# Patient Record
Sex: Female | Born: 1965 | Race: Asian | Hispanic: No | Marital: Married | Smoking: Never smoker
Health system: Southern US, Community
[De-identification: ages and names within clinical notes are randomized; demographics above are authoritative.]

## PROBLEM LIST (undated history)

## (undated) DIAGNOSIS — L659 Nonscarring hair loss, unspecified: Secondary | ICD-10-CM

## (undated) DIAGNOSIS — E785 Hyperlipidemia, unspecified: Secondary | ICD-10-CM

## (undated) DIAGNOSIS — H1010 Acute atopic conjunctivitis, unspecified eye: Secondary | ICD-10-CM

## (undated) DIAGNOSIS — M5090 Cervical disc disorder, unspecified, unspecified cervical region: Secondary | ICD-10-CM

## (undated) HISTORY — DX: Acute atopic conjunctivitis, unspecified eye: H10.10

## (undated) HISTORY — DX: Cervical disc disorder, unspecified, unspecified cervical region: M50.90

## (undated) HISTORY — DX: Hyperlipidemia, unspecified: E78.5

## (undated) HISTORY — DX: Nonscarring hair loss, unspecified: L65.9

---

## 2008-10-05 ENCOUNTER — Emergency Department (HOSPITAL_COMMUNITY): Admission: EM | Admit: 2008-10-05 | Discharge: 2008-10-05 | Payer: Self-pay | Admitting: Emergency Medicine

## 2009-06-28 IMAGING — CR DG CHEST 2V
2 series · 2 of 2 positions shown · non-contrast
Comparison: None.

CLINICAL DATA: 42-year-old female with pain under the chest,
abdominal pain and sweating.

CHEST - 2 VIEW

[w chest pa]
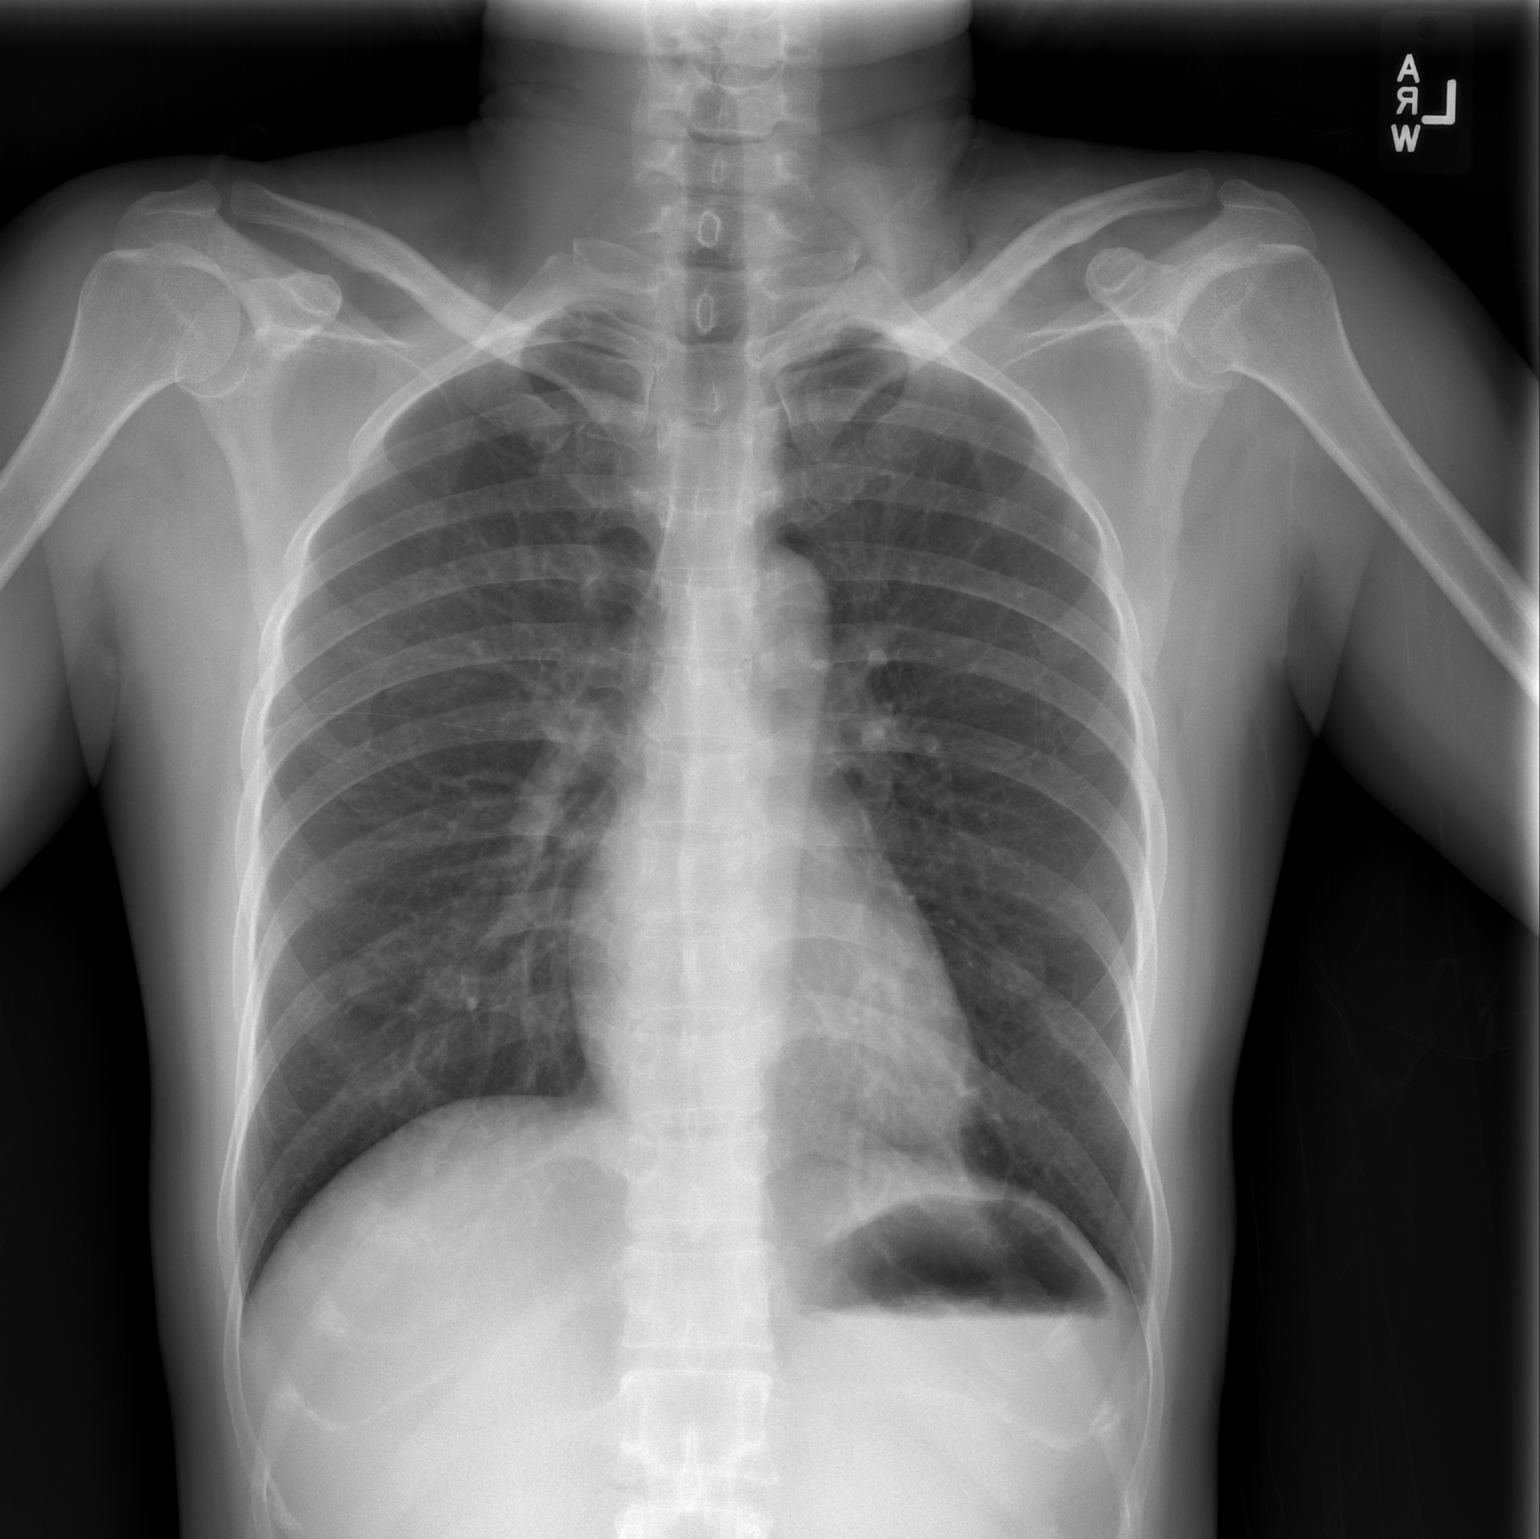

[w chest lat]
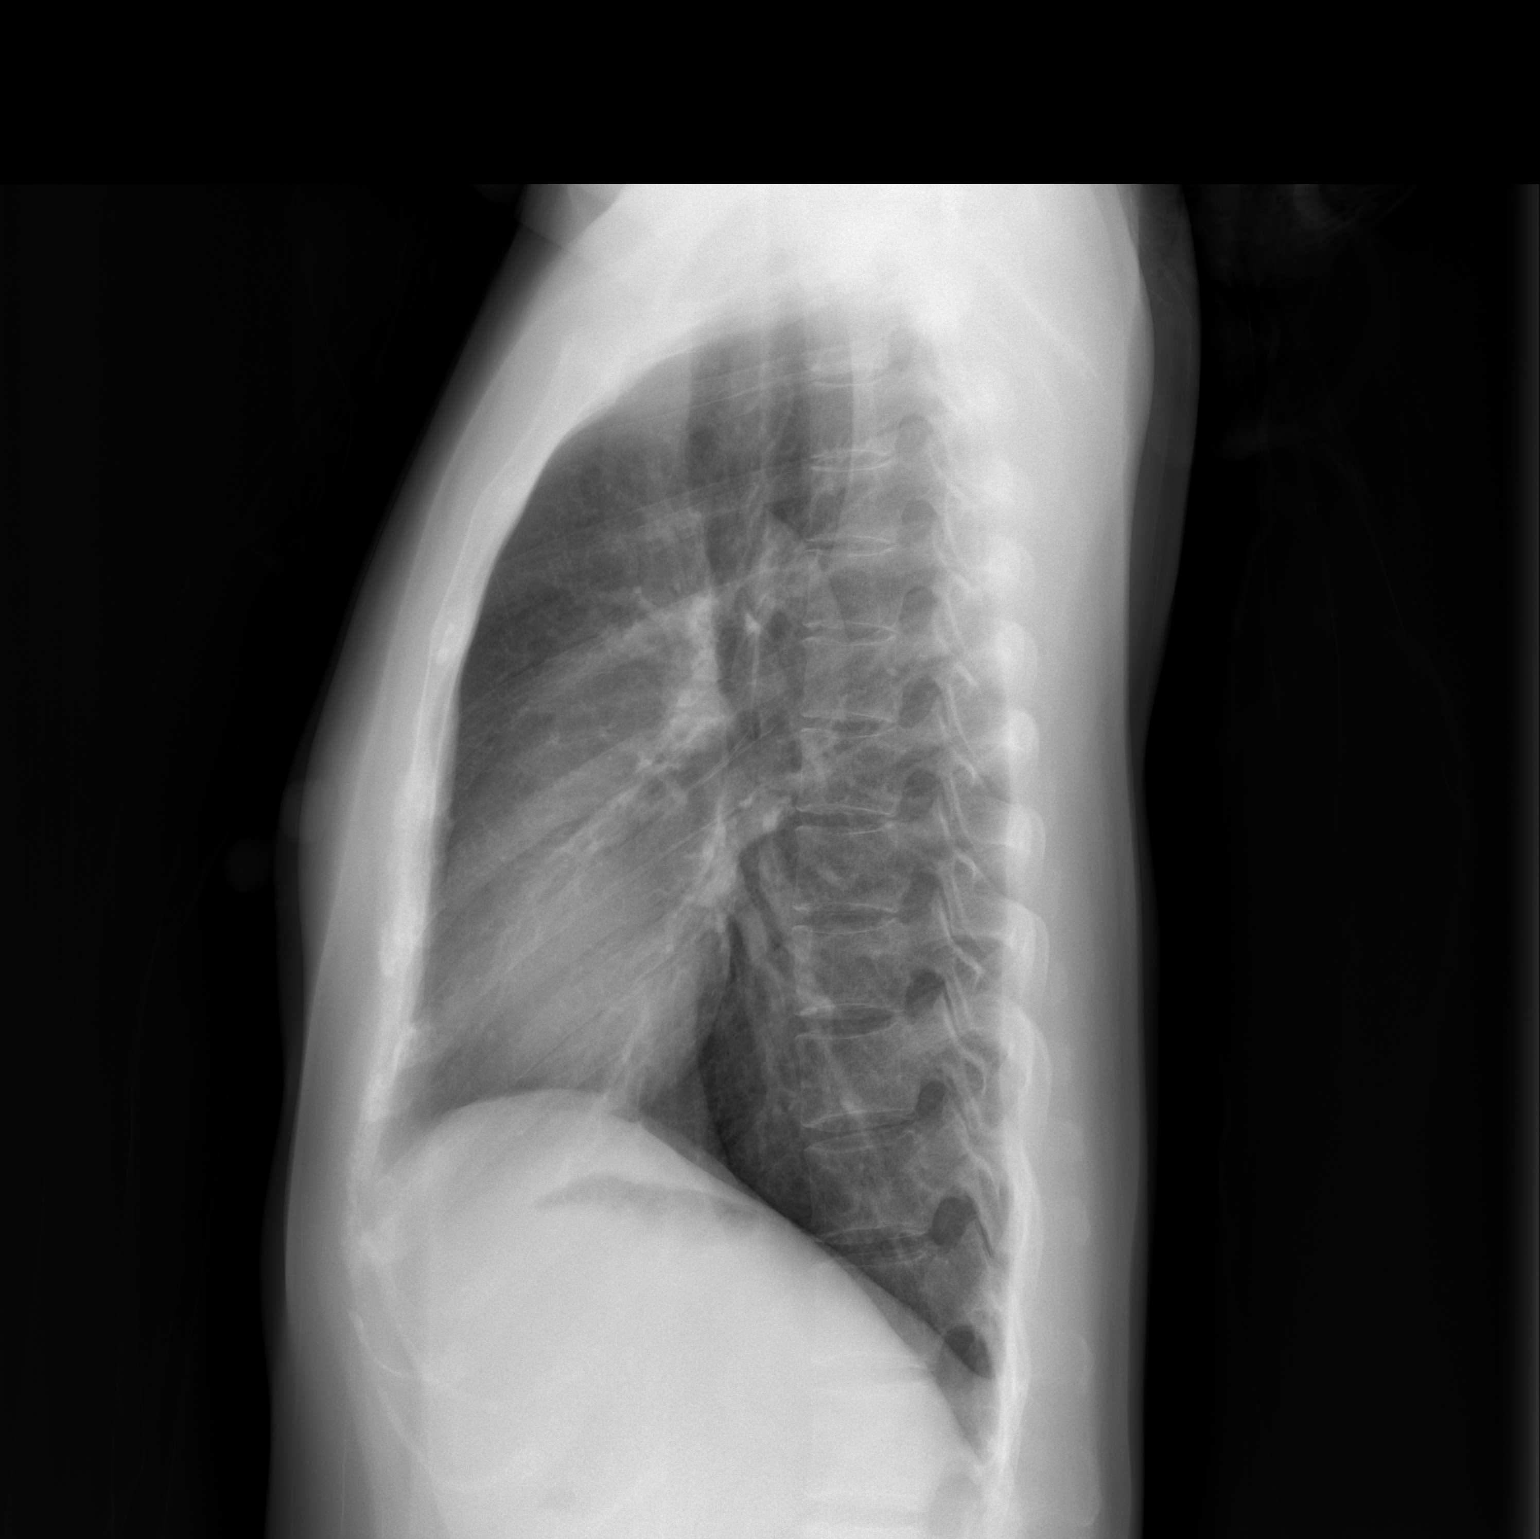

[2 of 2 positions shown; findings below may reference images not displayed]

FINDINGS: Incidental bilateral nipple shadows.  Cardiac size and
mediastinal contours are within normal limits.  Lung volumes are at
the upper limits of normal.  No pneumothorax, pulmonary edema,
pleural effusion or confluent airspace opacity. Tracheal air column
is within normal limits.  No acute osseous abnormality identified.
IMPRESSION: No acute cardiopulmonary abnormality.

## 2009-06-28 IMAGING — US US ABDOMEN COMPLETE
1 series · 14 of 25 positions shown · non-contrast
Comparison: 

CLINICAL DATA: Abdominal pain.

ABDOMINAL ULTRASOUND
TECHNIQUE: Abdominal ultrasound examination was performed
including evaluation of the liver, gallbladder, bile ducts,
pancreas, kidneys, spleen, IVC, and abdominal aorta.

[Series 1: us abdomen complete · 0.30mm/px · 50 acquisitions, 14 frames shown]
[im 1/50]
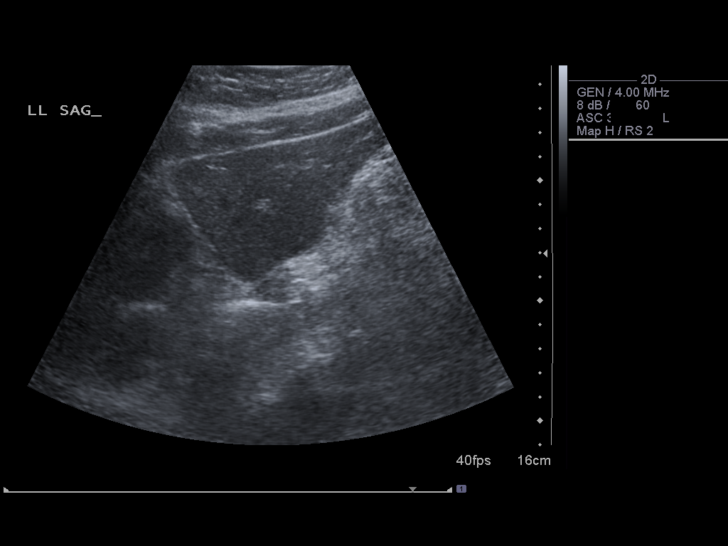
[im 5/50]
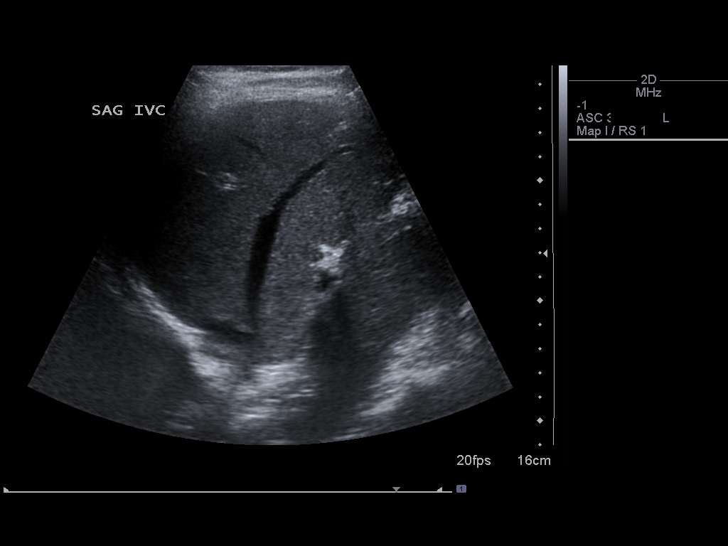
[im 9/50]
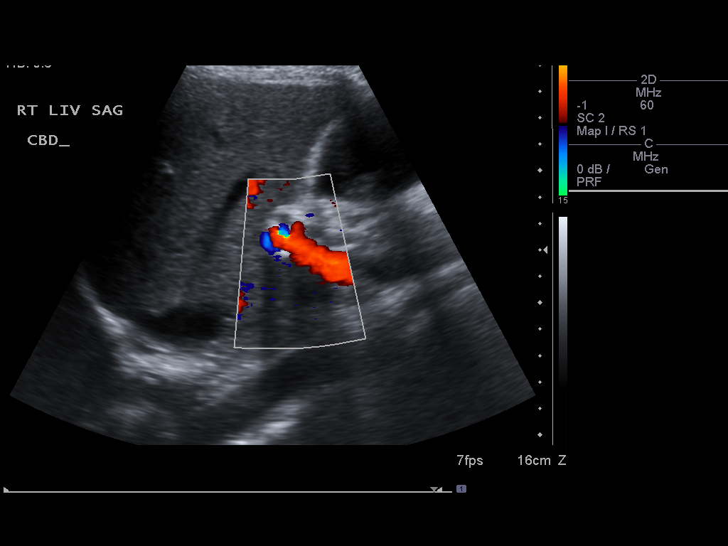
[im 13/50]
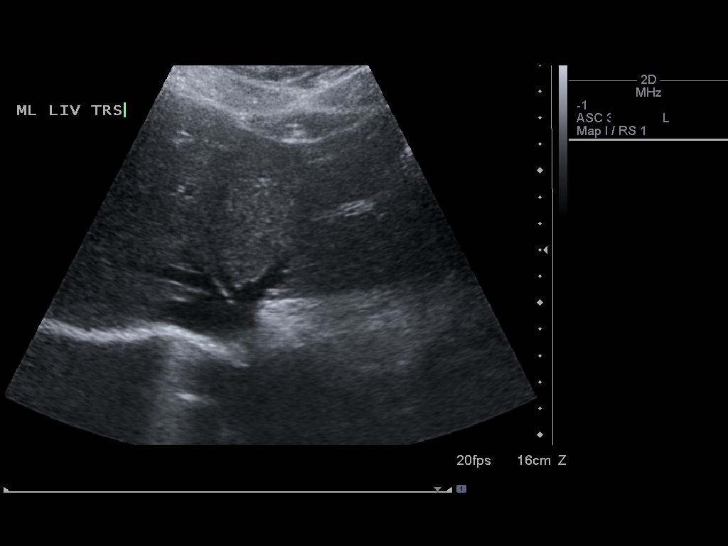
[im 17/50]
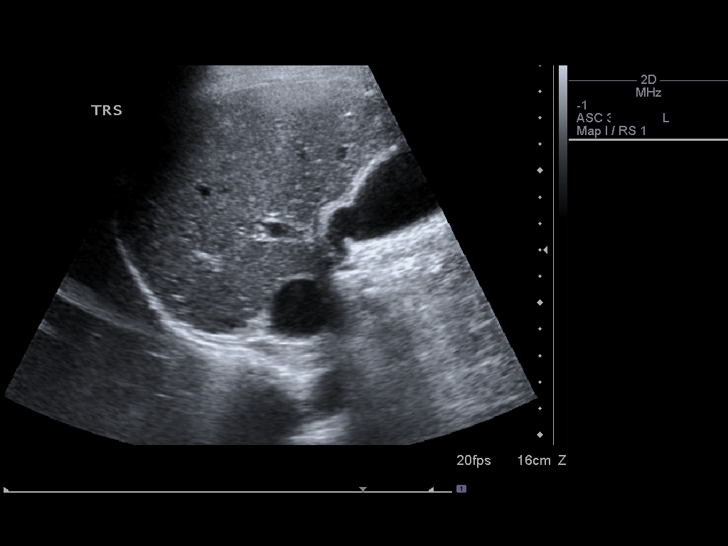
[im 19/50]
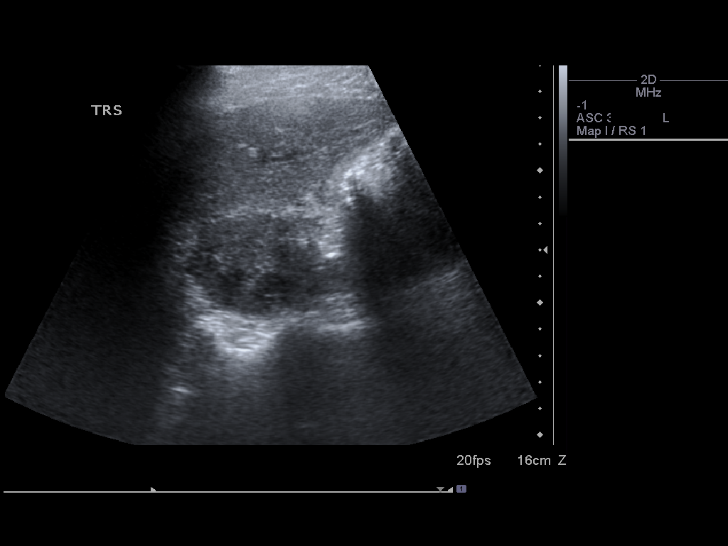
[im 23/50]
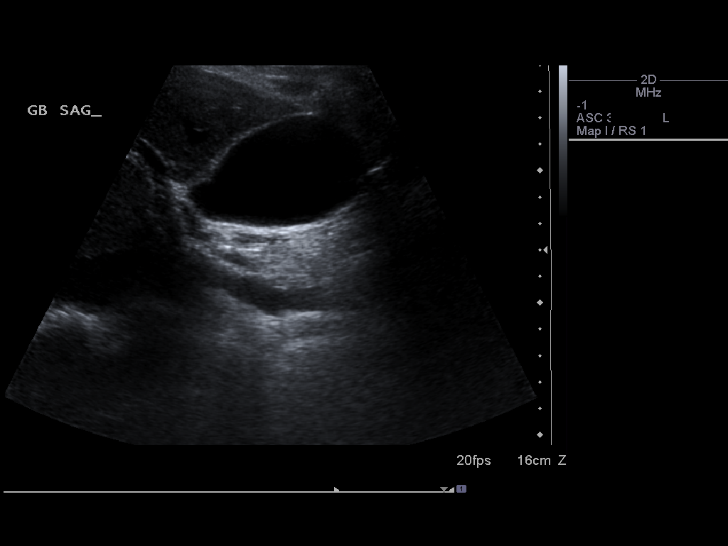
[im 27/50]
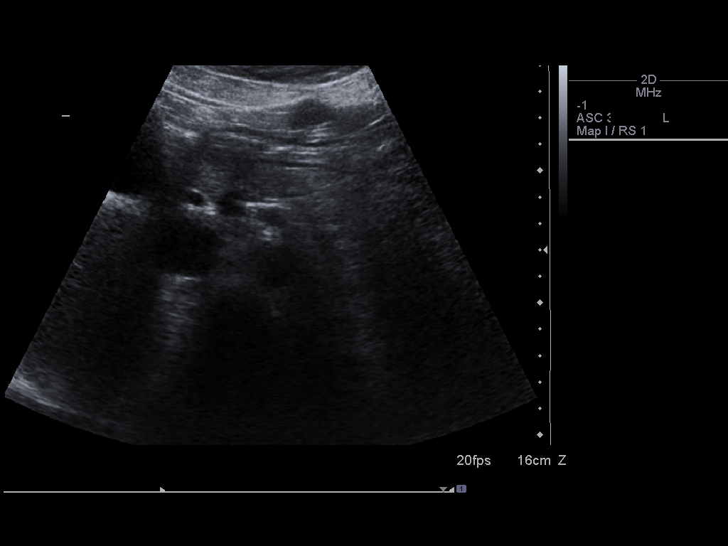
[im 31/50]
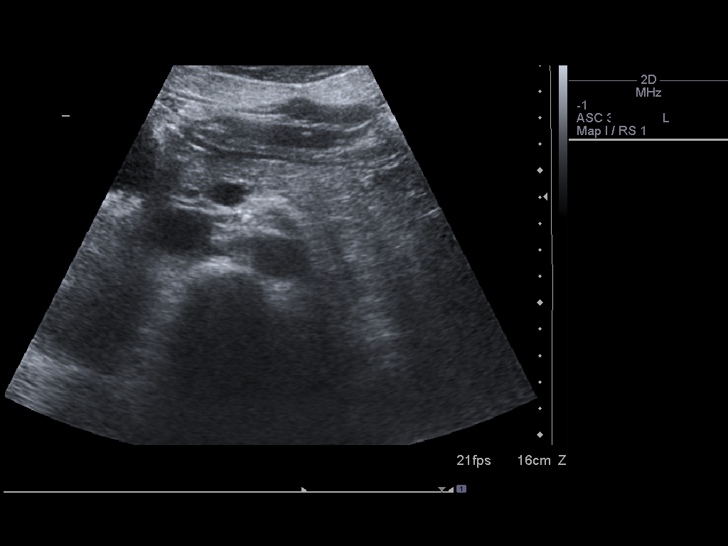
[im 33/50]
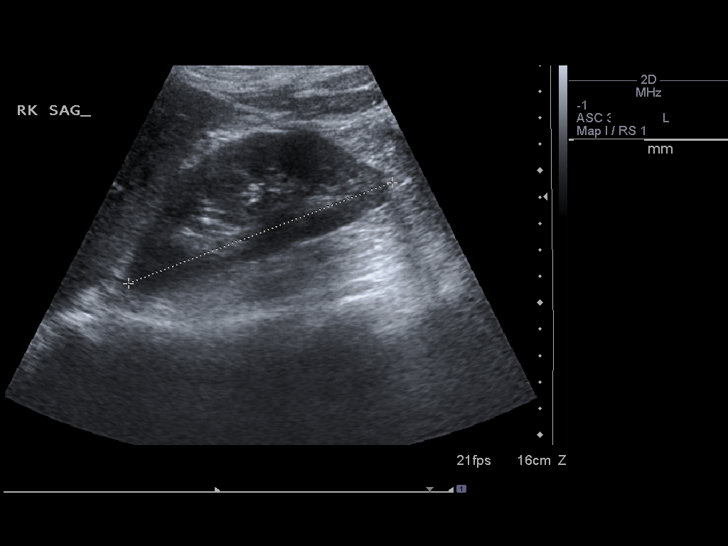
[im 37/50]
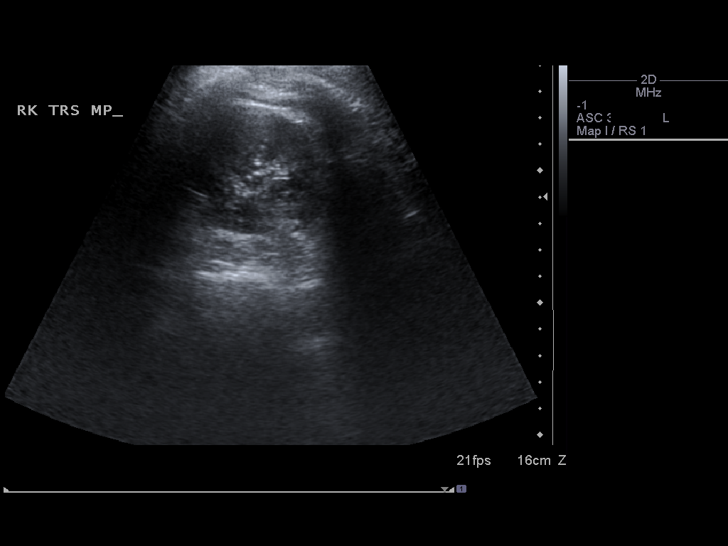
[im 41/50]
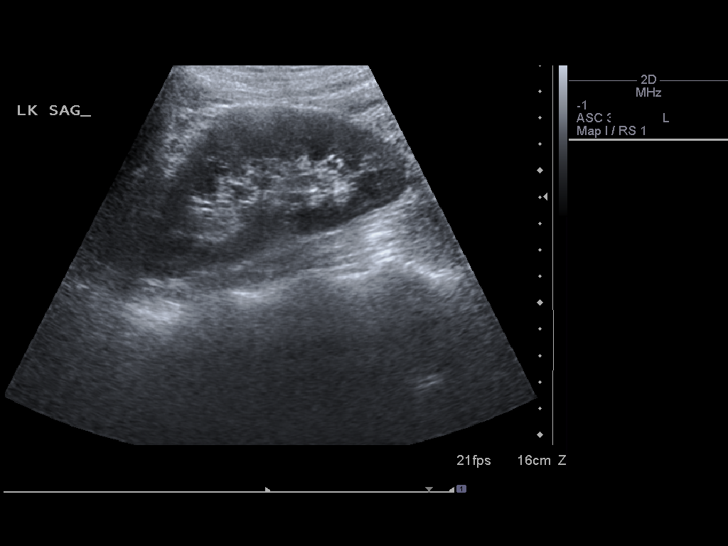
[im 45/50]
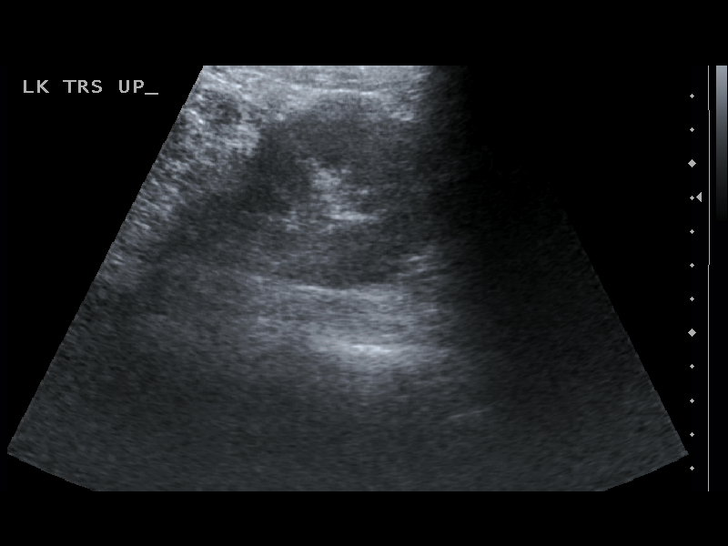
[im 50/50]
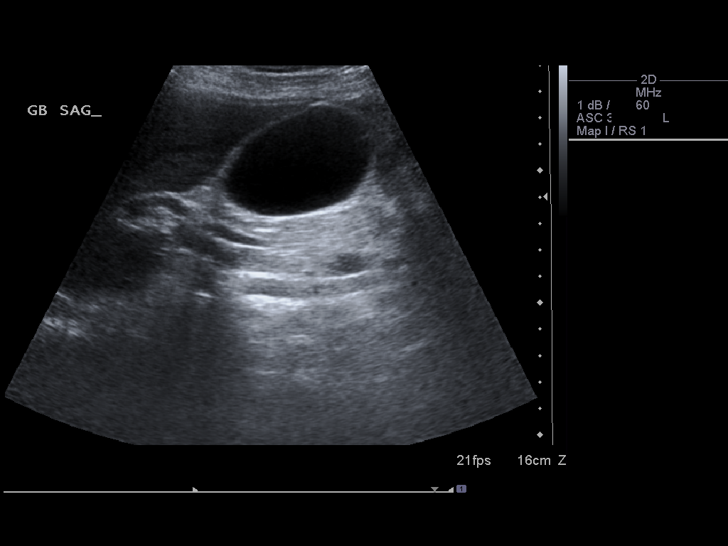

[14 of 25 positions shown; findings below may reference images not displayed]

FINDINGS: Gallbladder:  No gallstones, gallbladder wall thickening, or
pericholecystic fluid.

Common bile duct: Within normal limits in caliber.

Liver:  No focal parenchymal abnormalities.  Within normal limits
in parenchymal echogenicity.

Inferior vena cava:  Visualized portion unremarkable.

Pancreas:  Visualized portion unremarkable.

Spleen:  Within normal limits in size and echogenicity.

Right kidney:  Within normal limits in size and echogenicity. No
evidence of mass or hydronephrosis.

Left kidney:  Within normal limits in size and echogenicity. No
evidence of mass or hydronephrosis.

Abdominal aorta:  Within normal limits in caliber.
IMPRESSION: Negative abdominal ultrasound.

## 2010-12-25 LAB — CBC
Platelets: 163 10*3/uL (ref 150–400)
RDW: 12.4 % (ref 11.5–15.5)

## 2010-12-25 LAB — COMPREHENSIVE METABOLIC PANEL
ALT: 19 U/L (ref 0–35)
Albumin: 4.1 g/dL (ref 3.5–5.2)
Alkaline Phosphatase: 44 U/L (ref 39–117)
BUN: 10 mg/dL (ref 6–23)
Potassium: 3.5 mEq/L (ref 3.5–5.1)
Sodium: 137 mEq/L (ref 135–145)
Total Protein: 6.8 g/dL (ref 6.0–8.3)

## 2010-12-25 LAB — URINALYSIS, ROUTINE W REFLEX MICROSCOPIC
Glucose, UA: NEGATIVE mg/dL
Leukocytes, UA: NEGATIVE
pH: 7 (ref 5.0–8.0)

## 2010-12-25 LAB — PROTIME-INR: INR: 1 (ref 0.00–1.49)

## 2010-12-25 LAB — DIFFERENTIAL
Basophils Relative: 0 % (ref 0–1)
Monocytes Absolute: 0.3 10*3/uL (ref 0.1–1.0)
Monocytes Relative: 3 % (ref 3–12)
Neutro Abs: 9.5 10*3/uL — ABNORMAL HIGH (ref 1.7–7.7)

## 2010-12-25 LAB — URINE MICROSCOPIC-ADD ON

## 2013-07-01 ENCOUNTER — Ambulatory Visit (INDEPENDENT_AMBULATORY_CARE_PROVIDER_SITE_OTHER): Payer: BC Managed Care – PPO | Admitting: Neurology

## 2013-07-01 ENCOUNTER — Encounter (INDEPENDENT_AMBULATORY_CARE_PROVIDER_SITE_OTHER): Payer: Self-pay

## 2013-07-01 ENCOUNTER — Encounter: Payer: Self-pay | Admitting: Neurology

## 2013-07-01 VITALS — BP 113/73 | HR 75 | Temp 97.9°F | Ht 64.4 in | Wt 120.0 lb

## 2013-07-01 DIAGNOSIS — M5090 Cervical disc disorder, unspecified, unspecified cervical region: Secondary | ICD-10-CM | POA: Insufficient documentation

## 2013-07-01 DIAGNOSIS — M5030 Other cervical disc degeneration, unspecified cervical region: Secondary | ICD-10-CM

## 2013-07-01 NOTE — Patient Instructions (Signed)
Overall you are doing fairly well but I do want to suggest a few things today:   Remember to drink plenty of fluid, eat healthy meals and do not skip any meals. Try to eat protein with a every meal and eat a healthy snack such as fruit or nuts in between meals. Try to keep a regular sleep-wake schedule and try to exercise daily, particularly in the form of walking, 20-30 minutes a day, if you can.   I placed a referral for physical therapy to help treat your neck pain.   I would like to see you back in 6 months, sooner if we need to. Please call us with any interim questions, concerns, problems, updates or refill requests.   Please also call us for any test results so we can go over those with you on the phone.  My clinical assistant and will answer any of your questions and relay your messages to me and also relay most of my messages to you.   Our phone number is [phone]. We also have an after hours call service for urgent matters and there is a physician on-call for urgent questions. For any emergencies you know to call 911 or go to the nearest emergency room
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
## 2013-07-07 ENCOUNTER — Ambulatory Visit: Payer: BC Managed Care – PPO | Attending: Neurology | Admitting: Physical Therapy

## 2013-07-07 DIAGNOSIS — G8929 Other chronic pain: Secondary | ICD-10-CM | POA: Insufficient documentation

## 2013-07-07 DIAGNOSIS — IMO0001 Reserved for inherently not codable concepts without codable children: Secondary | ICD-10-CM | POA: Insufficient documentation

## 2013-07-07 DIAGNOSIS — M79609 Pain in unspecified limb: Secondary | ICD-10-CM | POA: Insufficient documentation

## 2013-07-07 DIAGNOSIS — M542 Cervicalgia: Secondary | ICD-10-CM | POA: Insufficient documentation

## 2013-07-13 ENCOUNTER — Ambulatory Visit: Payer: BC Managed Care – PPO | Attending: Neurology | Admitting: Physical Therapy

## 2013-07-13 DIAGNOSIS — M542 Cervicalgia: Secondary | ICD-10-CM | POA: Insufficient documentation

## 2013-07-13 DIAGNOSIS — G8929 Other chronic pain: Secondary | ICD-10-CM | POA: Insufficient documentation

## 2013-07-13 DIAGNOSIS — IMO0001 Reserved for inherently not codable concepts without codable children: Secondary | ICD-10-CM | POA: Insufficient documentation

## 2013-07-13 DIAGNOSIS — M79609 Pain in unspecified limb: Secondary | ICD-10-CM | POA: Insufficient documentation

## 2013-07-20 ENCOUNTER — Ambulatory Visit: Payer: BC Managed Care – PPO | Admitting: Physical Therapy

## 2013-07-28 ENCOUNTER — Ambulatory Visit: Payer: BC Managed Care – PPO | Admitting: Physical Therapy

## 2013-07-30 ENCOUNTER — Encounter: Payer: BC Managed Care – PPO | Admitting: Physical Therapy

## 2013-08-04 ENCOUNTER — Encounter: Payer: BC Managed Care – PPO | Admitting: Physical Therapy

## 2013-08-12 ENCOUNTER — Ambulatory Visit: Payer: BC Managed Care – PPO | Attending: Neurology | Admitting: Physical Therapy

## 2013-08-12 DIAGNOSIS — M542 Cervicalgia: Secondary | ICD-10-CM | POA: Insufficient documentation

## 2013-08-12 DIAGNOSIS — IMO0001 Reserved for inherently not codable concepts without codable children: Secondary | ICD-10-CM | POA: Insufficient documentation

## 2013-08-12 DIAGNOSIS — G8929 Other chronic pain: Secondary | ICD-10-CM | POA: Insufficient documentation

## 2013-08-12 DIAGNOSIS — M79609 Pain in unspecified limb: Secondary | ICD-10-CM | POA: Insufficient documentation

## 2013-08-19 ENCOUNTER — Ambulatory Visit: Payer: BC Managed Care – PPO | Admitting: Physical Therapy

## 2013-08-26 ENCOUNTER — Ambulatory Visit: Payer: BC Managed Care – PPO | Admitting: Physical Therapy

## 2013-09-22 ENCOUNTER — Encounter: Payer: BC Managed Care – PPO | Admitting: Physical Therapy

## 2013-12-29 ENCOUNTER — Ambulatory Visit: Payer: BC Managed Care – PPO | Admitting: Neurology

## 2014-01-05 ENCOUNTER — Ambulatory Visit: Payer: BC Managed Care – PPO | Admitting: Neurology

## 2014-01-20 ENCOUNTER — Ambulatory Visit: Payer: Self-pay | Admitting: Neurology

## 2015-01-18 DIAGNOSIS — H1013 Acute atopic conjunctivitis, bilateral: Secondary | ICD-10-CM | POA: Insufficient documentation

## 2015-01-18 DIAGNOSIS — M542 Cervicalgia: Secondary | ICD-10-CM | POA: Insufficient documentation

## 2015-01-18 DIAGNOSIS — Z0000 Encounter for general adult medical examination without abnormal findings: Secondary | ICD-10-CM | POA: Insufficient documentation

## 2015-01-18 DIAGNOSIS — R232 Flushing: Secondary | ICD-10-CM | POA: Insufficient documentation

## 2016-02-23 ENCOUNTER — Encounter: Payer: Self-pay | Admitting: Internal Medicine

## 2016-02-23 DIAGNOSIS — L209 Atopic dermatitis, unspecified: Secondary | ICD-10-CM | POA: Insufficient documentation

## 2016-04-13 ENCOUNTER — Encounter: Payer: Self-pay | Admitting: *Deleted

## 2016-04-25 ENCOUNTER — Encounter: Payer: Self-pay | Admitting: Internal Medicine

## 2016-04-25 ENCOUNTER — Ambulatory Visit (INDEPENDENT_AMBULATORY_CARE_PROVIDER_SITE_OTHER): Payer: BLUE CROSS/BLUE SHIELD | Admitting: Internal Medicine

## 2016-04-25 VITALS — BP 100/60 | HR 80 | Ht 63.5 in | Wt 124.8 lb

## 2016-04-25 DIAGNOSIS — Z8719 Personal history of other diseases of the digestive system: Secondary | ICD-10-CM

## 2016-04-25 DIAGNOSIS — Z1211 Encounter for screening for malignant neoplasm of colon: Secondary | ICD-10-CM | POA: Diagnosis not present

## 2016-04-25 MED ORDER — NA SULFATE-K SULFATE-MG SULF 17.5-3.13-1.6 GM/177ML PO SOLN: ORAL | 0 refills | Status: DC

## 2016-04-25 NOTE — Patient Instructions (Signed)
You have been scheduled for a colonoscopy. Please follow written instructions given to you at your visit today.  Please pick up your prep supplies at the pharmacy within the next 1-3 days. If you use inhalers (even only as needed), please bring them with you on the day of your procedure. Your physician has requested that you go to www.[email or web address].com and enter the access code given to you at your visit today. This web site gives a general overview about your procedure. However, you should still follow specific instructions given to you by our office regarding your preparation for the procedure.  Your physician has requested that you go to the basement for the following lab work before leaving today: H Pylori stool antigen  If you are age 51 or older, your body mass index should be between 23-30. Your Body mass index is 21.76 kg/m. If this is out of the aforementioned range listed, please consider follow up with your Primary Care Provider.  If you are age 51 or younger, your body mass index should be between 19-25. Your Body mass index is 21.76 kg/m. If this is out of the aformentioned range listed, please consider follow up with your Primary Care Provider.

## 2016-04-25 NOTE — Progress Notes (Signed)
HPI: Haley Baxter is a 50 year old female seen in consultation at the request of Dr. Ruben Gottron to discuss screening colonoscopy. She is here alone today. She has very little past medical history. She reports that she has been feeling well. She denies abdominal complaint. No abdominal pain, nausea, vomiting. Bowel habits are regular without blood in her stool or melena. She began a probiotic several months ago because she heard this was healthy. This is resulted in actually more regular bowel movements. No heartburn, dysphagia or odynophagia. She denies a family history of GI tract malignancy. Very rarely will she feel abdominal bloating which she feels this diet related.  She had a complete medical evaluation in [place] in 2013. She brings records. This included an upper endoscopy and colonoscopy. This was performed on 06/05/2012.  EGD revealed gastritis. There is no record of whether biopsies were performed. Colonoscopy revealed mild "mixed hemorrhoid", and was negative though it is noted in the ascending colon and cecum "negative in visible part except some stool"  Past Medical History:  Diagnosis Date  . Cervical disc disease   . Hyperlipidemia     History reviewed. No pertinent surgical history.  Outpatient Medications Prior to Visit  Medication Sig Dispense Refill  . doxycycline (VIBRA-TABS) 100 MG tablet 100 mg as needed.    Lenda Kelp FE 1/20 1-20 MG-MCG tablet      No facility-administered medications prior to visit.     No Known Allergies  Family History  Problem Relation Age of Onset  . High blood pressure Mother   . Ovarian cancer Mother   . Gout Father     Social History  Substance Use Topics  . Smoking status: Never Smoker  . Smokeless tobacco: Never Used  . Alcohol use No    ROS: As per history of present illness, otherwise negative  BP 100/60 (BP Location: Left Arm, Patient Position: Sitting, Cuff Size: Normal)   Pulse 80   Ht 5' 3.5" (1.613 m)   Wt 124 lb 12.8 oz  (56.6 kg)   BMI 21.76 kg/m  Constitutional: Well-developed and well-nourished. No distress. HEENT: Normocephalic and atraumatic. Oropharynx is clear and moist. No oropharyngeal exudate. Conjunctivae are normal.  No scleral icterus. Neck: Neck supple. Trachea midline. Cardiovascular: Normal rate, regular rhythm and intact distal pulses. No M/R/G Pulmonary/chest: Effort normal and breath sounds normal. No wheezing, rales or rhonchi. Abdominal: Soft, nontender, nondistended. Bowel sounds active throughout. There are no masses palpable. No hepatosplenomegaly. Extremities: no clubbing, cyanosis, or edema Lymphadenopathy: No cervical adenopathy noted. Neurological: Alert and oriented to person place and time. Skin: Skin is warm and dry. No rashes noted. Psychiatric: Normal mood and affect. Behavior is normal.  RELEVANT LABS AND IMAGING: CBC    Component Value Date/Time   WBC 10.7 (H) 10/05/2008 1456   RBC 4.74 10/05/2008 1456   HGB 14.4 10/05/2008 1456   HCT 42.7 10/05/2008 1456   PLT 163 10/05/2008 1456   MCV 90.1 10/05/2008 1456   MCHC 33.8 10/05/2008 1456   RDW 12.4 10/05/2008 1456   LYMPHSABS 0.9 10/05/2008 1456   MONOABS 0.3 10/05/2008 1456   EOSABS 0.0 10/05/2008 1456   BASOSABS 0.0 10/05/2008 1456    CMP     Component Value Date/Time   NA 137 10/05/2008 1456   K 3.5 10/05/2008 1456   CL 104 10/05/2008 1456   CO2 22 10/05/2008 1456   GLUCOSE 144 (H) 10/05/2008 1456   BUN 10 10/05/2008 1456   CREATININE 0.68 10/05/2008 1456  CALCIUM 9.0 10/05/2008 1456   PROT 6.8 10/05/2008 1456   ALBUMIN 4.1 10/05/2008 1456   AST 29 10/05/2008 1456   ALT 19 10/05/2008 1456   ALKPHOS 44 10/05/2008 1456   BILITOT 1.1 10/05/2008 1456   GFRNONAA >60 10/05/2008 1456   GFRAA  10/05/2008 1456    >60        The eGFR has been calculated using the MDRD equation. This calculation has not been validated in all clinical situations. eGFR's persistently <60 mL/min signify possible  Chronic Kidney Disease.    ASSESSMENT/PLAN: 50 year old female seen in consultation at the request of Dr. Ruben Gottron to discuss screening colonoscopy.   1. CRC screening -- average risk patient with no family history of colon cancer. We discussed that normal screening interval is every 10 years. She had a colonoscopy in 2013. It was noted on the report that the right colon was incompletely visualized due to stool. For this reason and the fact that she is now 50 years old, I recommended that we repeat colonoscopy for screening. This is based on the preparation noted in the right colon as less than good. We discussed the risk, benefit and alternatives and she wishes to proceed.  2. History of gastritis -- no family history of gastric cancer. I recommended H. pylori stool antigen to exclude this as a source of gastritis, particularly in light that H. pylori is more common in [place].    PW:[id number] M Aguiar, [address] [place], [place] [address]

## 2016-05-15 ENCOUNTER — Encounter: Payer: Self-pay | Admitting: Internal Medicine

## 2016-05-29 ENCOUNTER — Ambulatory Visit (AMBULATORY_SURGERY_CENTER): Payer: BLUE CROSS/BLUE SHIELD | Admitting: Internal Medicine

## 2016-05-29 ENCOUNTER — Encounter: Payer: Self-pay | Admitting: Internal Medicine

## 2016-05-29 VITALS — BP 117/78 | HR 52 | Temp 98.4°F | Resp 23 | Ht 63.5 in | Wt 124.0 lb

## 2016-05-29 DIAGNOSIS — Z1211 Encounter for screening for malignant neoplasm of colon: Secondary | ICD-10-CM

## 2016-05-29 MED ORDER — SODIUM CHLORIDE 0.9 % IV SOLN: 500.0000 mL | INTRAVENOUS | Status: AC

## 2016-05-29 NOTE — Patient Instructions (Signed)
YOU HAD AN ENDOSCOPIC PROCEDURE TODAY AT THE  ENDOSCOPY CENTER:   Refer to the procedure report that was given to you for any specific questions about what was found during the examination.  If the procedure report does not answer your questions, please call your gastroenterologist to clarify.  If you requested that your care partner not be given the details of your procedure findings, then the procedure report has been included in a sealed envelope for you to review at your convenience later.  YOU SHOULD EXPECT: Some feelings of bloating in the abdomen. Passage of more gas than usual.  Walking can help get rid of the air that was put into your GI tract during the procedure and reduce the bloating. If you had a lower endoscopy (such as a colonoscopy or flexible sigmoidoscopy) you may notice spotting of blood in your stool or on the toilet paper. If you underwent a bowel prep for your procedure, you may not have a normal bowel movement for a few days.  Please Note:  You might notice some irritation and congestion in your nose or some drainage.  This is from the oxygen used during your procedure.  There is no need for concern and it should clear up in a day or so.  SYMPTOMS TO REPORT IMMEDIATELY:   Following lower endoscopy (colonoscopy or flexible sigmoidoscopy):  Excessive amounts of blood in the stool  Significant tenderness or worsening of abdominal pains  Swelling of the abdomen that is new, acute  Fever of 100F or higher   Following upper endoscopy (EGD)  Vomiting of blood or coffee ground material  New chest pain or pain under the shoulder blades  Painful or persistently difficult swallowing  New shortness of breath  Fever of 100F or higher  Black, tarry-looking stools  For urgent or emergent issues, a gastroenterologist can be reached at any hour by calling [phone].   DIET:  We do recommend a small meal at first, but then you may proceed to your regular diet.  Drink  plenty of fluids but you should avoid alcoholic beverages for 24 hours.  ACTIVITY:  You should plan to take it easy for the rest of today and you should NOT DRIVE or use heavy machinery until tomorrow (because of the sedation medicines used during the test).    FOLLOW UP: Our staff will call the number listed on your records the next business day following your procedure to check on you and address any questions or concerns that you may have regarding the information given to you following your procedure. If we do not reach you, we will leave a message.  However, if you are feeling well and you are not experiencing any problems, there is no need to return our call.  We will assume that you have returned to your regular daily activities without incident.  If any biopsies were taken you will be contacted by phone or by letter within the next 1-3 weeks.  Please call us at [phone] if you have not heard about the biopsies in 3 weeks.    SIGNATURES/CONFIDENTIALITY: You and/or your care partner have signed paperwork which will be entered into your electronic medical record.  These signatures attest to the fact that that the information above on your After Visit Summary has been reviewed and is understood.  Full responsibility of the confidentiality of this discharge information lies with you and/or your care-partner.  Recall colonoscopy 10 years -2027

## 2016-05-29 NOTE — Progress Notes (Signed)
To recovery awake alert vss report to rn 

## 2016-05-29 NOTE — Op Note (Signed)
[facility] Patient Name: Abby PotashCissy Szalkowski Procedure Date: 05/29/2016 3:13 PM MRN: [id number] Endoscopist: Beverley FiedlerJay M Masson Nalepa , MD Age: 50 Referring MD:  Date of Birth: 03-24-1966 Gender: Female Account #: [id number] Procedure:                Colonoscopy Indications:              Screening for colorectal malignant neoplasm Medicines:                Monitored Anesthesia Care Procedure:                Pre-Anesthesia Assessment:                           - Prior to the procedure, a History and Physical                            was performed, and patient medications and                            allergies were reviewed. The patient's tolerance of                            previous anesthesia was also reviewed. The risks                            and benefits of the procedure and the sedation                            options and risks were discussed with the patient.                            All questions were answered, and informed consent                            was obtained. Prior Anticoagulants: The patient has                            taken no previous anticoagulant or antiplatelet                            agents. ASA Grade Assessment: II - A patient with                            mild systemic disease. After reviewing the risks                            and benefits, the patient was deemed in                            satisfactory condition to undergo the procedure.                           After obtaining informed consent, the colonoscope  was passed under direct vision. Throughout the                            procedure, the patient's blood pressure, pulse, and                            oxygen saturations were monitored continuously. The                            Model PCF-H190DL [id number]) scope was introduced                            through the anus and advanced to the the cecum,                            identified by  appendiceal orifice and ileocecal                            valve. The colonoscopy was performed without                            difficulty. The patient tolerated the procedure                            well. The quality of the bowel preparation was                            excellent. The ileocecal valve, appendiceal                            orifice, and rectum were photographed. Scope In: 3:25:15 PM Scope Out: 3:36:26 PM Scope Withdrawal Time: 0 hours 7 minutes 34 seconds  Total Procedure Duration: 0 hours 11 minutes 11 seconds  Findings:                 The perianal and digital rectal examinations were                            normal.                           The entire examined colon appeared normal on direct                            and retroflexion views. Complications:            No immediate complications. Estimated Blood Loss:     Estimated blood loss: none. Impression:               - The entire examined colon is normal on direct and                            retroflexion views.                           - No specimens collected. Recommendation:           -  Patient has a contact number available for                            emergencies. The signs and symptoms of potential                            delayed complications were discussed with the                            patient. Return to normal activities tomorrow.                            Written discharge instructions were provided to the                            patient.                           - Resume previous diet.                           - Continue present medications.                           - Repeat colonoscopy in 10 years for screening                            purposes. Beverley Fiedler, MD 05/29/2016 3:39:21 PM This report has been signed electronically.

## 2016-05-30 ENCOUNTER — Telehealth: Payer: Self-pay | Admitting: *Deleted

## 2016-05-30 NOTE — Telephone Encounter (Signed)
  Follow up Call-  Call back number 05/29/2016  Post procedure Call Back phone  # [phone]  Permission to leave phone message Yes  Some recent data might be hidden     Patient questions:  Do you have a fever, pain , or abdominal swelling? No. Pain Score  0 *  Have you tolerated food without any problems? Yes.    Have you been able to return to your normal activities? Yes.    Do you have any questions about your discharge instructions: Diet   No. Medications  No. Follow up visit  No.  Do you have questions or concerns about your Care? No.  Actions: * If pain score is 4 or above: No action needed, pain <4.

## 2019-12-17 ENCOUNTER — Ambulatory Visit: Payer: BLUE CROSS/BLUE SHIELD | Attending: Internal Medicine

## 2019-12-17 DIAGNOSIS — Z23 Encounter for immunization: Secondary | ICD-10-CM

## 2019-12-17 NOTE — Progress Notes (Signed)
   Covid-19 Vaccination Clinic  Name:  Haley Baxter    MRN: [id number] DOB: 1965-10-06  12/17/2019  [name] was observed post Covid-19 immunization for 15 minutes without incident. She was provided with Vaccine Information Sheet and instruction to access the V-Safe system.   [name] was instructed to call 911 with any severe reactions post vaccine: Marland Kitchen Difficulty breathing  . Swelling of face and throat  . A fast heartbeat  . A bad rash all over body  . Dizziness and weakness   Immunizations Administered    Name Date Dose VIS Date Route   Pfizer COVID-19 Vaccine 12/17/2019  3:36 PM 0.3 mL 08/21/2019 Intramuscular   Manufacturer: [employer], [employer]   Lot: [id number]   NDC: [address]-6

## 2020-01-11 ENCOUNTER — Ambulatory Visit: Payer: BLUE CROSS/BLUE SHIELD | Attending: Internal Medicine

## 2020-01-11 DIAGNOSIS — Z23 Encounter for immunization: Secondary | ICD-10-CM

## 2020-01-11 NOTE — Progress Notes (Signed)
   Covid-19 Vaccination Clinic  Name:  Haley Baxter    MRN: [id number] DOB: Mar 28, 1966  01/11/2020  [name] was observed post Covid-19 immunization for 15 minutes without incident. She was provided with Vaccine Information Sheet and instruction to access the V-Safe system.   [name] was instructed to call 911 with any severe reactions post vaccine: Marland Kitchen Difficulty breathing  . Swelling of face and throat  . A fast heartbeat  . A bad rash all over body  . Dizziness and weakness   Immunizations Administered    Name Date Dose VIS Date Route   Pfizer COVID-19 Vaccine 01/11/2020  9:57 AM 0.3 mL 11/04/2018 Intramuscular   Manufacturer: [employer], [employer]   Lot: [id number]   NDC: [address]-5

## 2022-04-26 DIAGNOSIS — Z1231 Encounter for screening mammogram for malignant neoplasm of breast: Secondary | ICD-10-CM | POA: Diagnosis not present

## 2022-05-08 DIAGNOSIS — Z1321 Encounter for screening for nutritional disorder: Secondary | ICD-10-CM | POA: Diagnosis not present

## 2022-05-08 DIAGNOSIS — R7989 Other specified abnormal findings of blood chemistry: Secondary | ICD-10-CM | POA: Diagnosis not present

## 2022-05-08 DIAGNOSIS — Z23 Encounter for immunization: Secondary | ICD-10-CM | POA: Diagnosis not present

## 2022-05-08 DIAGNOSIS — Z124 Encounter for screening for malignant neoplasm of cervix: Secondary | ICD-10-CM | POA: Diagnosis not present

## 2022-05-08 DIAGNOSIS — E7800 Pure hypercholesterolemia, unspecified: Secondary | ICD-10-CM | POA: Diagnosis not present

## 2022-05-08 DIAGNOSIS — H1013 Acute atopic conjunctivitis, bilateral: Secondary | ICD-10-CM | POA: Diagnosis not present

## 2022-05-08 DIAGNOSIS — B351 Tinea unguium: Secondary | ICD-10-CM | POA: Diagnosis not present

## 2022-05-08 DIAGNOSIS — Z0000 Encounter for general adult medical examination without abnormal findings: Secondary | ICD-10-CM | POA: Diagnosis not present

## 2022-05-08 DIAGNOSIS — L659 Nonscarring hair loss, unspecified: Secondary | ICD-10-CM | POA: Diagnosis not present

## 2022-05-08 DIAGNOSIS — Z13220 Encounter for screening for lipoid disorders: Secondary | ICD-10-CM | POA: Diagnosis not present

## 2022-05-08 DIAGNOSIS — Z1329 Encounter for screening for other suspected endocrine disorder: Secondary | ICD-10-CM | POA: Diagnosis not present

## 2022-05-08 DIAGNOSIS — Z0001 Encounter for general adult medical examination with abnormal findings: Secondary | ICD-10-CM | POA: Diagnosis not present

## 2022-05-08 DIAGNOSIS — L209 Atopic dermatitis, unspecified: Secondary | ICD-10-CM | POA: Diagnosis not present

## 2022-05-08 DIAGNOSIS — Z7951 Long term (current) use of inhaled steroids: Secondary | ICD-10-CM | POA: Diagnosis not present

## 2022-05-08 DIAGNOSIS — Z780 Asymptomatic menopausal state: Secondary | ICD-10-CM | POA: Diagnosis not present

## 2022-05-08 LAB — HM PAP SMEAR: HM Pap smear: NEGATIVE

## 2022-05-22 DIAGNOSIS — Z780 Asymptomatic menopausal state: Secondary | ICD-10-CM | POA: Diagnosis not present

## 2022-05-22 DIAGNOSIS — Z13820 Encounter for screening for osteoporosis: Secondary | ICD-10-CM | POA: Diagnosis not present

## 2022-05-22 DIAGNOSIS — M8589 Other specified disorders of bone density and structure, multiple sites: Secondary | ICD-10-CM | POA: Diagnosis not present

## 2022-05-22 DIAGNOSIS — M8580 Other specified disorders of bone density and structure, unspecified site: Secondary | ICD-10-CM | POA: Diagnosis not present

## 2023-01-08 DIAGNOSIS — L650 Telogen effluvium: Secondary | ICD-10-CM | POA: Diagnosis not present

## 2023-06-12 DIAGNOSIS — Z1231 Encounter for screening mammogram for malignant neoplasm of breast: Secondary | ICD-10-CM | POA: Diagnosis not present

## 2023-07-30 DIAGNOSIS — E7800 Pure hypercholesterolemia, unspecified: Secondary | ICD-10-CM | POA: Diagnosis not present

## 2023-07-30 DIAGNOSIS — E559 Vitamin D deficiency, unspecified: Secondary | ICD-10-CM | POA: Diagnosis not present

## 2023-07-30 DIAGNOSIS — L209 Atopic dermatitis, unspecified: Secondary | ICD-10-CM | POA: Diagnosis not present

## 2023-10-01 DIAGNOSIS — H43813 Vitreous degeneration, bilateral: Secondary | ICD-10-CM | POA: Diagnosis not present

## 2023-12-11 ENCOUNTER — Ambulatory Visit: Payer: BLUE CROSS/BLUE SHIELD | Admitting: Family Medicine

## 2024-01-20 ENCOUNTER — Encounter (HOSPITAL_COMMUNITY): Payer: Self-pay

## 2024-01-27 ENCOUNTER — Ambulatory Visit: Admitting: Family Medicine

## 2024-01-27 ENCOUNTER — Encounter: Payer: Self-pay | Admitting: Family Medicine

## 2024-01-27 ENCOUNTER — Other Ambulatory Visit: Payer: Self-pay | Admitting: Family Medicine

## 2024-01-27 VITALS — BP 110/70 | HR 86 | Temp 98.6°F | Ht 63.5 in | Wt 119.0 lb

## 2024-01-27 DIAGNOSIS — Z8249 Family history of ischemic heart disease and other diseases of the circulatory system: Secondary | ICD-10-CM

## 2024-01-27 DIAGNOSIS — J302 Other seasonal allergic rhinitis: Secondary | ICD-10-CM

## 2024-01-27 DIAGNOSIS — L209 Atopic dermatitis, unspecified: Secondary | ICD-10-CM | POA: Diagnosis not present

## 2024-01-27 DIAGNOSIS — E785 Hyperlipidemia, unspecified: Secondary | ICD-10-CM | POA: Diagnosis not present

## 2024-01-27 MED ORDER — ROSUVASTATIN CALCIUM 10 MG PO TABS: 10.0000 mg | ORAL_TABLET | Freq: Every day | ORAL | 3 refills | Status: AC

## 2024-01-27 MED ORDER — TACROLIMUS 0.1 % EX OINT: TOPICAL_OINTMENT | Freq: Two times a day (BID) | CUTANEOUS | 1 refills | Status: DC

## 2024-01-27 MED ORDER — MOMETASONE FUROATE 0.1 % EX CREA: 1.0000 | TOPICAL_CREAM | Freq: Every day | CUTANEOUS | 0 refills | Status: AC

## 2024-01-27 MED ORDER — DOXYCYCLINE HYCLATE 100 MG PO TABS: 100.0000 mg | ORAL_TABLET | Freq: Two times a day (BID) | ORAL | 0 refills | Status: AC

## 2024-01-27 MED ORDER — DESLORATADINE 5 MG PO TABS: 5.0000 mg | ORAL_TABLET | Freq: Every day | ORAL | 3 refills | Status: AC

## 2024-01-27 NOTE — Progress Notes (Signed)
 Patient Office Visit  Assessment & Plan:  Hyperlipidemia, unspecified hyperlipidemia type -     Comprehensive metabolic panel with GFR -     Lipid panel -     CBC with Differential/Platelet -     TSH -     Rosuvastatin  Calcium ; Take 1 tablet (10 mg total) by mouth daily.  Dispense: 90 tablet; Refill: 3 -     CT CARDIAC SCORING (SELF PAY ONLY); Future  Family history of heart disease -     CT CARDIAC SCORING (SELF PAY ONLY); Future  Atopic dermatitis, unspecified type -     Mometasone  Furoate; Apply 1 Application topically daily. Apply BID  Dispense: 45 g; Refill: 0 -     Tacrolimus ; Apply topically 2 (two) times daily. Apply BID  Dispense: 30 g; Refill: 1 -     Doxycycline  Hyclate; Take 1 tablet (100 mg total) by mouth 2 (two) times daily.  Dispense: 60 tablet; Refill: 0  Seasonal allergies -     Desloratadine ; Take 1 tablet (5 mg total) by mouth daily.  Dispense: 90 tablet; Refill: 3   Assessment and Plan    Hyperlipidemia Chronic hyperlipidemia with elevated cholesterol levels and family history of cardiovascular disease. Statin therapy recommended to reduce cardiovascular risk. - Order calcium  score test at [facility] for coronary artery disease risk assessment. - Prescribe statin therapy (Lipitor or Crestor ) for hyperlipidemia management.     Atopic dermatitis- Refilled medications Allergies- Clarinex  is effective.      No follow-ups on file.   Subjective:     Patient ID: Haley Baxter, female    DOB: 22-Aug-1966  Age: 58 y.o. MRN: [id number]  Chief Complaint  Patient presents with   Medical Management of Chronic Issues   Establish Care    Wants to discuss cholesterol. Her mother was recently hospitalized for a heart attack.    HPI Discussed the use of AI scribe software for clinical note transcription with the patient, who gave verbal consent to proceed.  Is here to establish primary care and discuss hyperlipidemia, allergies and atopic dermatitis.  History  of Present Illness   Haley Baxter is a 58 year old female with high cholesterol who presents for evaluation of her condition.  She has a history of high cholesterol with levels consistently around 200, recently increasing significantly. She maintains a healthy diet and exercises regularly. No chest pain during exercise.  Her family history is significant for heart disease. Her 72 year old mother recently suffered a heart attack in March, initially presenting with difficulty breathing, later attributed to a 100% blockage in a major blood vessel. Her mother has been in and out of the ICU due to bradycardia, with consideration for a CRTD device due to permanent heart damage. Additionally, her mother had a TIA two years ago but recovered fully. Her brother, aged 29, underwent a scan with dye injection to check for blockages after her mother's heart attack and was found to have some issues but has since managed to lower his cholesterol levels.  She was exposed to secondhand smoke during childhood as her father smoked, although she and her brother do not smoke. Her mother had ovarian cancer over 20 years ago and recovered completely.  She is experiencing stress due to frequent travel to [place] to care for her mother, impacting her weight and overall well-being.     Allergic Rhinitis: Haley Baxter is here for evaluation of allergic rhinitis. Patient's symptoms include cough, headaches, nasal congestion, postnasal drip, sinus pressure  and sneezing. These symptoms are seasonal. Current triggers include exposure to pollens. The patient has been suffering from these symptoms for approximately for years. Pt has been taking Clarinex  which helps the best. Zyrtec makes her sedated LABS-reviewed Last November  Cholesterol: always over 200, last time it was 289, LDL 198  DIAGNOSTIC Cardiac catheterization: Her mom in [place]- 100% occlusion in one coronary artery Assessment & Plan Hyperlipidemia Chronic hyperlipidemia with  elevated cholesterol levels and family history of cardiovascular disease. Statin therapy recommended to reduce cardiovascular risk. - Order calcium  score [facility] at [facility] for coronary artery disease risk assessment. - Prescribe statin therapy (Lipitor or Crestor ) for hyperlipidemia management. Allergies- refilled med Atopic Dermatitis vs Rosacea- refilled meds.     The 10-year ASCVD risk score (Arnett DK, et al., 2019) is: 2.2%  Past Medical History:  Diagnosis Date   Allergic conjunctivitis    Cervical disc disease    Hair loss    Hyperlipidemia    History reviewed. No pertinent surgical history. Social History   Tobacco Use   Smoking status: Never   Smokeless tobacco: Never  Vaping Use   Vaping status: Never Used  Substance Use Topics   Alcohol use: No   Drug use: No   Family History  Problem Relation Age of Onset   High blood pressure Mother    Ovarian cancer Mother    Stroke Mother    Heart attack Mother    Gout Father    Hypertension Brother    Hyperlipidemia Brother    Premature CHD Neg Hx    No Known Allergies  ROS    Objective:    BP 110/70   Pulse 86   Temp 98.6 F (37 C)   Ht 5' 3.5" (1.613 m)   Wt 119 lb (54 kg)   SpO2 96%   BMI 20.75 kg/m  BP Readings from Last 3 Encounters:  01/27/24 110/70  05/29/16 117/78  04/25/16 100/60   Wt Readings from Last 3 Encounters:  01/27/24 119 lb (54 kg)  05/29/16 124 lb (56.2 kg)  04/25/16 124 lb 12.8 oz (56.6 kg)    Physical Exam Vitals and nursing note reviewed.  Constitutional:      Appearance: Normal appearance.  HENT:     Head: Normocephalic.     Right Ear: Tympanic membrane, ear canal and external ear normal.     Left Ear: Tympanic membrane, ear canal and external ear normal.  Eyes:     Extraocular Movements: Extraocular movements intact.     Conjunctiva/sclera: Conjunctivae normal.     Pupils: Pupils are equal, round, and reactive to light.  Cardiovascular:     Rate and Rhythm:  Normal rate and regular rhythm.     Heart sounds: Normal heart sounds.  Pulmonary:     Effort: Pulmonary effort is normal.     Breath sounds: Normal breath sounds.  Musculoskeletal:     Right lower leg: No edema.     Left lower leg: No edema.  Neurological:     General: No focal deficit present.     Mental Status: She is alert and oriented to person, place, and time.  Psychiatric:        Mood and Affect: Mood normal.        Behavior: Behavior normal.        Thought Content: Thought content normal.        Judgment: Judgment normal.      Results for orders placed or performed in visit  on 01/27/24  HM PAP SMEAR  Result Value Ref Range   HM Pap smear NEGATIVE

## 2024-01-28 ENCOUNTER — Ambulatory Visit: Payer: Self-pay | Admitting: Family Medicine

## 2024-01-28 LAB — CBC WITH DIFFERENTIAL/PLATELET
Absolute Lymphocytes: 1990 {cells}/uL (ref 850–3900)
Absolute Monocytes: 440 {cells}/uL (ref 200–950)
Basophils Absolute: 19 {cells}/uL (ref 0–200)
Basophils Relative: 0.3 %
Eosinophils Absolute: 12 {cells}/uL — ABNORMAL LOW (ref 15–500)
Eosinophils Relative: 0.2 %
HCT: 43.6 % (ref 35.0–45.0)
Hemoglobin: 14.4 g/dL (ref 11.7–15.5)
MCH: 30.4 pg (ref 27.0–33.0)
MCHC: 33 g/dL (ref 32.0–36.0)
MCV: 92.2 fL (ref 80.0–100.0)
MPV: 10.8 fL (ref 7.5–12.5)
Monocytes Relative: 7.1 %
Neutro Abs: 3739 {cells}/uL (ref 1500–7800)
Neutrophils Relative %: 60.3 %
Platelets: 161 10*3/uL (ref 140–400)
RBC: 4.73 10*6/uL (ref 3.80–5.10)
RDW: 12.6 % (ref 11.0–15.0)
Total Lymphocyte: 32.1 %
WBC: 6.2 10*3/uL (ref 3.8–10.8)

## 2024-01-28 LAB — COMPREHENSIVE METABOLIC PANEL WITH GFR
AG Ratio: 2.3 (calc) (ref 1.0–2.5)
ALT: 22 U/L (ref 6–29)
AST: 31 U/L (ref 10–35)
Albumin: 4.9 g/dL (ref 3.6–5.1)
Alkaline phosphatase (APISO): 61 U/L (ref 37–153)
BUN: 17 mg/dL (ref 7–25)
CO2: 30 mmol/L (ref 20–32)
Calcium: 9.4 mg/dL (ref 8.6–10.4)
Chloride: 101 mmol/L (ref 98–110)
Creat: 0.76 mg/dL (ref 0.50–1.03)
Globulin: 2.1 g/dL (ref 1.9–3.7)
Glucose, Bld: 93 mg/dL (ref 65–99)
Potassium: 4.2 mmol/L (ref 3.5–5.3)
Sodium: 140 mmol/L (ref 135–146)
Total Bilirubin: 0.9 mg/dL (ref 0.2–1.2)
Total Protein: 7 g/dL (ref 6.1–8.1)
eGFR: 91 mL/min/{1.73_m2} (ref >=60)

## 2024-01-28 LAB — LIPID PANEL
Cholesterol: 279 mg/dL — ABNORMAL HIGH (ref <200)
HDL: 76 mg/dL (ref >=50)
LDL Cholesterol (Calc): 189 mg/dL — ABNORMAL HIGH
Non-HDL Cholesterol (Calc): 203 mg/dL — ABNORMAL HIGH (ref <130)
Total CHOL/HDL Ratio: 3.7 (calc) (ref <5.0)
Triglycerides: 51 mg/dL (ref <150)

## 2024-01-28 LAB — TSH: TSH: 1.51 m[IU]/L (ref 0.40–4.50)

## 2024-01-29 NOTE — Telephone Encounter (Signed)
 Requested medication (s) are due for refill today: na  Requested medication (s) are on the active medication list: yes  Last refill:  01/27/24 #30 g 1 refills  Future visit scheduled: no   Notes to clinic:  not delegated per protocol. Pharmacy comment: Alternative Requested:PA REQUIRED.       Requested Prescriptions  Pending Prescriptions Disp Refills   tacrolimus  (PROTOPIC ) 0.1 % ointment [Pharmacy Med Name: TACROLIMUS  0.1% OINTMENT] 30 g 1    Sig: APPLY TOPICALLY 2 (TWO) TIMES DAILY     Not Delegated - Immunology: Immunosuppressive Agents - tacrolimus  Failed - 01/29/2024 12:13 PM      Failed - This refill cannot be delegated      Failed - Valid encounter within last 12 months    Recent Outpatient Visits           2 days ago Hyperlipidemia, unspecified hyperlipidemia type   [facility] Amadeo June, MD             Not Delegated - Immunology: Immunosuppressive Agents (Topical) Failed - 01/29/2024 12:13 PM      Failed - This refill cannot be delegated      Failed - Valid encounter within last 12 months    Recent Outpatient Visits           2 days ago Hyperlipidemia, unspecified hyperlipidemia type   [place] [facility] Amadeo June, MD

## 2024-02-07 ENCOUNTER — Other Ambulatory Visit (HOSPITAL_COMMUNITY): Payer: Self-pay

## 2024-02-07 ENCOUNTER — Telehealth: Payer: Self-pay

## 2024-02-07 NOTE — Telephone Encounter (Signed)
 PLEASE BE ADVISED Clinical questions have been answered and PA submitted.TO PLAN. PA currently Pending.      Pharmacy Patient Advocate Encounter   Received notification from CoverMyMeds that prior authorization for Tacrolimus  0.1% ointment  is required/requested.   Insurance verification completed.   The patient is insured through [employer] .   Per test claim: PA required; PA started via CoverMyMeds. KEY [id number] . Waiting for clinical questions to populate.

## 2024-02-11 ENCOUNTER — Other Ambulatory Visit (HOSPITAL_COMMUNITY)

## 2024-02-11 NOTE — Telephone Encounter (Signed)
 Pharmacy Patient Advocate Encounter  Received notification from [facility] that Prior Authorization for Tacrolimus  0.1% ointment has been APPROVED from 02/07/2024 to 02/06/2025

## 2024-03-04 ENCOUNTER — Other Ambulatory Visit (HOSPITAL_COMMUNITY)

## 2024-03-10 ENCOUNTER — Ambulatory Visit (HOSPITAL_COMMUNITY)
Admission: RE | Admit: 2024-03-10 | Discharge: 2024-03-10 | Disposition: A | Discharge status: Home / Self Care | Payer: Self-pay | Source: Ambulatory Visit | Attending: Family Medicine | Admitting: Family Medicine

## 2024-03-10 DIAGNOSIS — E785 Hyperlipidemia, unspecified: Secondary | ICD-10-CM | POA: Insufficient documentation

## 2024-03-10 DIAGNOSIS — Z8249 Family history of ischemic heart disease and other diseases of the circulatory system: Secondary | ICD-10-CM | POA: Insufficient documentation

## 2024-08-13 DIAGNOSIS — Z1231 Encounter for screening mammogram for malignant neoplasm of breast: Secondary | ICD-10-CM | POA: Diagnosis not present

## 2024-08-13 LAB — HM MAMMOGRAPHY

## 2024-08-18 ENCOUNTER — Encounter: Payer: Self-pay | Admitting: Family Medicine

## 2025-01-15 ENCOUNTER — Encounter: Admitting: Family Medicine

## 2025-01-27 ENCOUNTER — Encounter: Admitting: Family Medicine
